# Patient Record
Sex: Female | Born: 2005
Health system: Southern US, Community
[De-identification: ages and names within clinical notes are randomized; demographics above are authoritative.]

## PROBLEM LIST (undated history)

## (undated) DIAGNOSIS — R55 Syncope and collapse: Secondary | ICD-10-CM

## (undated) DIAGNOSIS — F419 Anxiety disorder, unspecified: Secondary | ICD-10-CM

## (undated) HISTORY — PX: ROOT CANAL: SHX2363

## (undated) HISTORY — DX: Syncope and collapse: R55

## (undated) HISTORY — DX: Anxiety disorder, unspecified: F41.9

---
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
## 2023-11-12 ENCOUNTER — Encounter (HOSPITAL_BASED_OUTPATIENT_CLINIC_OR_DEPARTMENT_OTHER): Payer: Self-pay | Admitting: Emergency Medicine

## 2023-11-12 ENCOUNTER — Other Ambulatory Visit: Payer: Self-pay

## 2023-11-12 ENCOUNTER — Emergency Department (HOSPITAL_BASED_OUTPATIENT_CLINIC_OR_DEPARTMENT_OTHER)
Admission: EM | Admit: 2023-11-12 | Discharge: 2023-11-13 | Disposition: A | Discharge status: Home / Self Care | Payer: 59 | Attending: Emergency Medicine | Admitting: Emergency Medicine

## 2023-11-12 DIAGNOSIS — R112 Nausea with vomiting, unspecified: Secondary | ICD-10-CM | POA: Insufficient documentation

## 2023-11-12 DIAGNOSIS — R55 Syncope and collapse: Secondary | ICD-10-CM | POA: Diagnosis present

## 2023-11-12 DIAGNOSIS — R109 Unspecified abdominal pain: Secondary | ICD-10-CM | POA: Insufficient documentation

## 2023-11-12 DIAGNOSIS — R000 Tachycardia, unspecified: Secondary | ICD-10-CM | POA: Diagnosis not present

## 2023-11-12 DIAGNOSIS — R197 Diarrhea, unspecified: Secondary | ICD-10-CM | POA: Diagnosis not present

## 2023-11-12 LAB — CBC WITH DIFFERENTIAL/PLATELET
Abs Immature Granulocytes: 0.05 10*3/uL (ref 0.00–0.07)
Basophils Absolute: 0 10*3/uL (ref 0.0–0.1)
Basophils Relative: 0 %
Eosinophils Absolute: 0 10*3/uL (ref 0.0–0.5)
Eosinophils Relative: 0 %
HCT: 44.1 % (ref 36.0–46.0)
Hemoglobin: 15 g/dL (ref 12.0–15.0)
Immature Granulocytes: 0 %
Lymphocytes Relative: 5 %
Lymphs Abs: 0.8 10*3/uL (ref 0.7–4.0)
MCH: 29.9 pg (ref 26.0–34.0)
MCHC: 34 g/dL (ref 30.0–36.0)
MCV: 88 fL (ref 80.0–100.0)
Monocytes Absolute: 1.4 10*3/uL — ABNORMAL HIGH (ref 0.1–1.0)
Monocytes Relative: 9 %
Neutro Abs: 13.3 10*3/uL — ABNORMAL HIGH (ref 1.7–7.7)
Neutrophils Relative %: 86 %
Platelets: 382 10*3/uL (ref 150–400)
RBC: 5.01 MIL/uL (ref 3.87–5.11)
RDW: 13.2 % (ref 11.5–15.5)
WBC: 15.6 10*3/uL — ABNORMAL HIGH (ref 4.0–10.5)
nRBC: 0 % (ref 0.0–0.2)

## 2023-11-12 LAB — CBG MONITORING, ED: Glucose-Capillary: 121 mg/dL — ABNORMAL HIGH (ref 70–99)

## 2023-11-12 MED ORDER — LACTATED RINGERS IV BOLUS
1000.0000 mL | Freq: Once | INTRAVENOUS | Status: AC
  Administered 2023-11-13: 1000 mL via INTRAVENOUS

## 2023-11-12 MED ORDER — LORAZEPAM 2 MG/ML IJ SOLN
0.5000 mg | Freq: Once | INTRAMUSCULAR | Status: AC
  Administered 2023-11-13: 0.5 mg via INTRAVENOUS
  Filled 2023-11-12: qty 1

## 2023-11-12 NOTE — ED Triage Notes (Signed)
Patient c/o emesis x 8 hours.  Patient's visitor stated patient is also dizzy and "passed out" once, and hasn't been checked for fever.  Patient also endorses abdominal pain.

## 2023-11-12 NOTE — ED Provider Notes (Signed)
[place] [facility] AT [facility] Provider Note   CSN: [id number] Arrival date & time: 11/12/23  2331     History  Chief Complaint  Patient presents with   Emesis    Crystal Solis is a 18 y.o. female.  The history is provided by the patient and a parent.  Emesis Crystal Jaspers is a 18 y.o. female who presents to the Emergency Department complaining of vomiting and diarrhea.  She presents to the emergency department accompanied by her father for evaluation of vomiting and diarrhea that started about 8 hours ago.  She reports numerous episodes of vomiting and diarrhea with associated central abdominal pain.  She thinks she might have a fever but is not completely certain.  No associated dysuria.  She has also had multiple associated syncopal episodes today.  She did have 1 when she arrived to triage with a heart rate in the 170s per nursing.  This was unable to be captured on twelve-lead.  Nursing reports unresponsive episode for 10 to 15 seconds.  Patient states that everything was dark during that episode.  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  She takes oral contraceptives.  No known medical problems.     Home Medications Prior to Admission medications   Medication Sig Start Date End Date Taking? Authorizing Provider  ondansetron (ZOFRAN-ODT) 4 MG disintegrating tablet Take 1 tablet (4 mg total) by mouth every 8 (eight) hours as needed. 11/13/23  Yes Tilden Fossa, MD  benzonatate (TESSALON) 100 MG capsule Take 1 capsule by mouth 3 times daily as needed x 7 days, do not crush or chew 02/28/21     cefdinir (OMNICEF) 300 MG capsule Take 1 capsule by mouth every 12 hours x10 day(s) 02/28/21     chlorpheniramine-HYDROcodone (TUSSIONEX) 10-8 MG/5ML LQCR Take 1.5 mLs by mouth every 12 (twelve) hours.    [provider]  guaiFENesin (ROBITUSSIN) 100 MG/5ML liquid Take 200 mg by mouth 3 (three) times daily as needed. For cough    [provider]  ibuprofen (ADVIL,MOTRIN) 100 MG/5ML suspension Take 10 mg/kg by mouth every 6 (six) hours as needed. For pain/fever    [provider]  Norethindrone Acetate-Ethinyl Estradiol (LARIN 1.5/30) 1.5-30 MG-MCG tablet Take 1 tablet by mouth daily. Take continuously and take with food. 06/05/22     Norethindrone Acetate-Ethinyl Estradiol (LOESTRIN) 1.5-30 MG-MCG tablet TAKE 1 TABLET BY MOUTH ONCE DAILY WITH FOOD 04/12/20 04/12/21  Kirby Crigler, MD  Norethindrone Acetate-Ethinyl Estradiol (LOESTRIN) 1.5-30 MG-MCG tablet TAKE 1 TABLET BY MOUTH ONCE DAILY WITH FOOD. 02/27/20 02/26/21  Marcene Corning, MD      Allergies    Patient has no known allergies.    Review of Systems   Review of Systems  Gastrointestinal:  Positive for vomiting.  All other systems reviewed and are negative.   Physical Exam Updated Vital Signs BP 100/62   Pulse (!) 110   Temp 99 F (37.2 C)   Resp 18   Wt 63.5 kg   LMP  (LMP Unknown)   SpO2 97%  Physical Exam Vitals and nursing note reviewed.  Constitutional:      Appearance: She is well-developed.  HENT:     Head: Normocephalic and atraumatic.  Cardiovascular:     Rate and Rhythm: Regular rhythm. Tachycardia present.     Heart sounds: No murmur heard. Pulmonary:     Effort: Pulmonary effort is normal. No respiratory distress.     Breath sounds:  Normal breath sounds.  Abdominal:     Palpations: Abdomen is soft.     Tenderness: There is no abdominal tenderness. There is no guarding or rebound.  Musculoskeletal:        General: No tenderness.  Skin:    General: Skin is warm and dry.  Neurological:     Mental Status: She is alert and oriented to person, place, and time.  Psychiatric:     Comments: Anxious     ED Results / Procedures / Treatments   Labs (all labs ordered are listed, but only abnormal results are displayed) Labs Reviewed  CBC WITH DIFFERENTIAL/PLATELET - Abnormal; Notable for the following components:      Result  Value   WBC 15.6 (*)    Neutro Abs 13.3 (*)    Monocytes Absolute 1.4 (*)    All other components within normal limits  COMPREHENSIVE METABOLIC PANEL - Abnormal; Notable for the following components:   Glucose, Bld 130 (*)    All other components within normal limits  URINALYSIS, ROUTINE W REFLEX MICROSCOPIC - Abnormal; Notable for the following components:   APPearance HAZY (*)    Hgb urine dipstick TRACE (*)    Ketones, ur >=80 (*)    Leukocytes,Ua TRACE (*)    All other components within normal limits  D-DIMER, QUANTITATIVE - Abnormal; Notable for the following components:   D-Dimer, Quant 0.77 (*)    All other components within normal limits  URINALYSIS, MICROSCOPIC (REFLEX) - Abnormal; Notable for the following components:   Bacteria, UA FEW (*)    All other components within normal limits  CBG MONITORING, ED - Abnormal; Notable for the following components:   Glucose-Capillary 121 (*)    All other components within normal limits  PREGNANCY, URINE  LIPASE, BLOOD  TROPONIN I (HIGH SENSITIVITY)    EKG EKG Interpretation Date/Time:  Thursday November 12 2023 23:56:32 EST Ventricular Rate:  141 PR Interval:  137 QRS Duration:  77 QT Interval:  277 QTC Calculation: 425 R Axis:   74  Text Interpretation: Sinus tachycardia Confirmed by Tilden Fossa [phone]) on 11/12/2023 11:57:56 PM  Radiology CT Angio Chest PE W/Cm &/Or Wo Cm Result Date: 11/13/2023 CLINICAL DATA:  Positive D-dimer. Syncope with abdominal pain and vomiting. EXAM: CT ANGIOGRAPHY CHEST CT ABDOMEN AND PELVIS WITH CONTRAST TECHNIQUE: Multidetector CT imaging of the chest was performed using the standard protocol during bolus administration of intravenous contrast. Multiplanar CT image reconstructions and MIPs were obtained to evaluate the vascular anatomy. Multidetector CT imaging of the abdomen and pelvis was performed using the standard protocol during bolus administration of intravenous contrast. RADIATION  DOSE REDUCTION: This exam was performed according to the departmental dose-optimization program which includes automated exposure control, adjustment of the mA and/or kV according to patient size and/or use of iterative reconstruction technique. CONTRAST:  OMNIPAQUE IOHEXOL 350 MG/ML SOLN COMPARISON:  None Available. FINDINGS: CTA CHEST FINDINGS Cardiovascular: The heart size is normal. No substantial pericardial effusion. No thoracic aortic aneurysm. No substantial atherosclerosis of the thoracic aorta. There is no filling defect within the opacified pulmonary arteries to suggest the presence of an acute pulmonary embolus. Mediastinum/Nodes: No mediastinal lymphadenopathy. 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 Lymphoproliferative disorder is considered less likely but not entirely excluded. There is no hilar lymphadenopathy. The esophagus has normal imaging features. There is no axillary lymphadenopathy. Lungs/Pleura: The lungs are clear without focal pneumonia, edema, pneumothorax or pleural effusion. No suspicious pulmonary nodule  or mass. Musculoskeletal: No worrisome lytic or sclerotic osseous abnormality. Review of the MIP images confirms the above findings. CT ABDOMEN and PELVIS FINDINGS Hepatobiliary: No suspicious focal abnormality within the liver parenchyma. There is no evidence for gallstones, gallbladder wall thickening, or pericholecystic fluid. No intrahepatic or extrahepatic biliary dilation. Pancreas: No focal mass lesion. No dilatation of the main duct. No intraparenchymal cyst. No peripancreatic edema. Spleen: No splenomegaly. No suspicious focal mass lesion. Adrenals/Urinary Tract: No adrenal nodule or mass. Kidneys unremarkable. No evidence for hydroureter. The urinary bladder appears normal for the degree of distention. Stomach/Bowel: Stomach is unremarkable. No gastric wall thickening. No evidence of outlet obstruction. Duodenum is normally  positioned as is the ligament of Treitz. No small bowel wall thickening. No small bowel dilatation. The terminal ileum is normal. The appendix is normal. No gross colonic mass. No colonic wall thickening. Vascular/Lymphatic: No abdominal aortic aneurysm. No abdominal aortic atherosclerotic calcification. There is no gastrohepatic or hepatoduodenal ligament lymphadenopathy. No retroperitoneal or mesenteric lymphadenopathy. No pelvic sidewall lymphadenopathy. Reproductive: The uterus is unremarkable.  There is no adnexal mass. Other: No intraperitoneal free fluid. Musculoskeletal: No worrisome lytic or sclerotic osseous abnormality. IMPRESSION: 1. No CT evidence for acute pulmonary embolus. 2. No acute findings in the chest, abdomen, or pelvis. 3. 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 Lymphoproliferative disorder is considered less likely but not entirely excluded. Correlate clinically, and conservatively, follow-up CT chest in 3 months could be used to ensure stability. Electronically Signed   By: Kennith Center M.D.   On: 11/13/2023 06:29   CT ABDOMEN PELVIS W CONTRAST Result Date: 11/13/2023 CLINICAL DATA:  Positive D-dimer. Syncope with abdominal pain and vomiting. EXAM: CT ANGIOGRAPHY CHEST CT ABDOMEN AND PELVIS WITH CONTRAST TECHNIQUE: Multidetector CT imaging of the chest was performed using the standard protocol during bolus administration of intravenous contrast. Multiplanar CT image reconstructions and MIPs were obtained to evaluate the vascular anatomy. Multidetector CT imaging of the abdomen and pelvis was performed using the standard protocol during bolus administration of intravenous contrast. RADIATION DOSE REDUCTION: This exam was performed according to the departmental dose-optimization program which includes automated exposure control, adjustment of the mA and/or kV according to patient size and/or use of iterative reconstruction technique. CONTRAST:   OMNIPAQUE IOHEXOL 350 MG/ML SOLN COMPARISON:  None Available. FINDINGS: CTA CHEST FINDINGS Cardiovascular: The heart size is normal. No substantial pericardial effusion. No thoracic aortic aneurysm. No substantial atherosclerosis of the thoracic aorta. There is no filling defect within the opacified pulmonary arteries to suggest the presence of an acute pulmonary embolus. Mediastinum/Nodes: No mediastinal lymphadenopathy. 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 Lymphoproliferative disorder is considered less likely but not entirely excluded. There is no hilar lymphadenopathy. The esophagus has normal imaging features. There is no axillary lymphadenopathy. Lungs/Pleura: The lungs are clear without focal pneumonia, edema, pneumothorax or pleural effusion. No suspicious pulmonary nodule or mass. Musculoskeletal: No worrisome lytic or sclerotic osseous abnormality. Review of the MIP images confirms the above findings. CT ABDOMEN and PELVIS FINDINGS Hepatobiliary: No suspicious focal abnormality within the liver parenchyma. There is no evidence for gallstones, gallbladder wall thickening, or pericholecystic fluid. No intrahepatic or extrahepatic biliary dilation. Pancreas: No focal mass lesion. No dilatation of the main duct. No intraparenchymal cyst. No peripancreatic edema. Spleen: No splenomegaly. No suspicious focal mass lesion. Adrenals/Urinary Tract: No adrenal nodule or mass. Kidneys unremarkable. No evidence for hydroureter. The urinary bladder appears normal for  the degree of distention. Stomach/Bowel: Stomach is unremarkable. No gastric wall thickening. No evidence of outlet obstruction. Duodenum is normally positioned as is the ligament of Treitz. No small bowel wall thickening. No small bowel dilatation. The terminal ileum is normal. The appendix is normal. No gross colonic mass. No colonic wall thickening. Vascular/Lymphatic: No abdominal aortic aneurysm. No  abdominal aortic atherosclerotic calcification. There is no gastrohepatic or hepatoduodenal ligament lymphadenopathy. No retroperitoneal or mesenteric lymphadenopathy. No pelvic sidewall lymphadenopathy. Reproductive: The uterus is unremarkable.  There is no adnexal mass. Other: No intraperitoneal free fluid. Musculoskeletal: No worrisome lytic or sclerotic osseous abnormality. IMPRESSION: 1. No CT evidence for acute pulmonary embolus. 2. No acute findings in the chest, abdomen, or pelvis. 3. 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 Lymphoproliferative disorder is considered less likely but not entirely excluded. Correlate clinically, and conservatively, follow-up CT chest in 3 months could be used to ensure stability. Electronically Signed   By: Kennith Center M.D.   On: 11/13/2023 06:29    Procedures Procedures    Medications Ordered in ED Medications  lactated ringers bolus 1,000 mL (0 mLs Intravenous Stopped 11/13/23 0212)  LORazepam (ATIVAN) injection 0.5 mg (0.5 mg Intravenous Given 11/13/23 0041)  acetaminophen (TYLENOL) tablet 650 mg (650 mg Oral Given 11/13/23 0057)  lactated ringers bolus 1,000 mL (0 mLs Intravenous Stopped 11/13/23 0306)  sodium chloride 0.9 % bolus 500 mL (0 mLs Intravenous Stopped 11/13/23 0550)  iohexol (OMNIPAQUE) 350 MG/ML injection 100 mL (100 mLs Intravenous Contrast Given 11/13/23 0555)    ED Course/ Medical Decision Making/ A&P                                 Medical Decision Making Amount and/or Complexity of Data Reviewed Labs: ordered. Radiology: ordered.  Risk OTC drugs. Prescription drug management.   Patient with history of anxiety here for evaluation of vomiting, diarrhea.  She did have a syncopal episode in triage, heart rate was 170 on the monitor but nursing was unable to obtain twelve-lead.  Patient has had recurrent episodes of syncope in the past.  She was treated with IV fluids, lorazepam for  anxiety.  CMP without significant electrolyte abnormality.  CBC with leukocytosis.  Patient with persistent tachycardia although she is able to tolerate p.o. fluids.  She does get dizzy on standing and is slightly orthostatic initially.  A D-dimer was obtained given her oral contraceptive use and recurrent syncope, this was slightly elevated.  A CTA PE study was obtained, which is negative for PE but does demonstrate some changes to her thymus.  Given her significant abdominal pain earlier, leukocytosis a CT abdomen pelvis was obtained, which is negative for acute intra-abdominal abnormality.  UA is not consistent with UTI.  On repeat assessment later during patient's ED stay she is feeling improved, able to tolerate p.o. and she is able to ambulate without recurrent near syncopal episodes.  Her heart rate did improve as well.  Heart rates improved to 110.  She does have a history of tachycardia, in particular in the setting of healthcare encounters.  Given her recurrent syncope, will send referral to cardiology.  In terms of her vomiting and diarrhea, suspect this is self-limited, no recurrent vomiting in the emergency department.  Suspect there was an element of dehydration secondary to her symptoms.  Will send prescription for ondansetron to the pharmacy.  Discussed outpatient follow-up and return  precautions.        Final Clinical Impression(s) / ED Diagnoses Final diagnoses:  Syncope, unspecified syncope type  Nausea vomiting and diarrhea    Rx / DC Orders ED Discharge Orders          Ordered    ondansetron (ZOFRAN-ODT) 4 MG disintegrating tablet  Every 8 hours PRN        11/13/23 0647    Ambulatory referral to Cardiology        11/13/23 0648              Tilden Fossa, MD 11/13/23 [phone]

## 2023-11-13 ENCOUNTER — Emergency Department (HOSPITAL_BASED_OUTPATIENT_CLINIC_OR_DEPARTMENT_OTHER): Payer: 59

## 2023-11-13 LAB — URINALYSIS, ROUTINE W REFLEX MICROSCOPIC
Bilirubin Urine: NEGATIVE
Glucose, UA: NEGATIVE mg/dL
Ketones, ur: >=80 mg/dL — AB
Nitrite: NEGATIVE
Protein, ur: NEGATIVE mg/dL
Specific Gravity, Urine: 1.02 (ref 1.005–1.030)
pH: 7 (ref 5.0–8.0)

## 2023-11-13 LAB — COMPREHENSIVE METABOLIC PANEL
ALT: 12 U/L (ref 0–44)
AST: 22 U/L (ref 15–41)
Albumin: 3.8 g/dL (ref 3.5–5.0)
Alkaline Phosphatase: 52 U/L (ref 38–126)
Anion gap: 11 (ref 5–15)
BUN: 15 mg/dL (ref 6–20)
CO2: 22 mmol/L (ref 22–32)
Calcium: 8.9 mg/dL (ref 8.9–10.3)
Chloride: 104 mmol/L (ref 98–111)
Creatinine, Ser: 0.67 mg/dL (ref 0.44–1.00)
GFR, Estimated: >60 mL/min (ref >60)
Glucose, Bld: 130 mg/dL — ABNORMAL HIGH (ref 70–99)
Potassium: 4.2 mmol/L (ref 3.5–5.1)
Sodium: 137 mmol/L (ref 135–145)
Total Bilirubin: 0.7 mg/dL (ref 0.0–1.2)
Total Protein: 7.6 g/dL (ref 6.5–8.1)

## 2023-11-13 LAB — URINALYSIS, MICROSCOPIC (REFLEX)

## 2023-11-13 LAB — LIPASE, BLOOD: Lipase: 29 U/L (ref 11–51)

## 2023-11-13 LAB — PREGNANCY, URINE: Preg Test, Ur: NEGATIVE

## 2023-11-13 LAB — D-DIMER, QUANTITATIVE: D-Dimer, Quant: 0.77 ug{FEU}/mL — ABNORMAL HIGH (ref 0.00–0.50)

## 2023-11-13 LAB — TROPONIN I (HIGH SENSITIVITY): Troponin I (High Sensitivity): 7 ng/L (ref <18)

## 2023-11-13 MED ORDER — LACTATED RINGERS IV BOLUS
1000.0000 mL | Freq: Once | INTRAVENOUS | Status: AC
  Administered 2023-11-13: 1000 mL via INTRAVENOUS

## 2023-11-13 MED ORDER — IOHEXOL 350 MG/ML SOLN
100.0000 mL | Freq: Once | INTRAVENOUS | Status: AC | PRN
  Administered 2023-11-13: 100 mL via INTRAVENOUS

## 2023-11-13 MED ORDER — SODIUM CHLORIDE 0.9 % IV BOLUS
500.0000 mL | Freq: Once | INTRAVENOUS | Status: AC
  Administered 2023-11-13: 500 mL via INTRAVENOUS

## 2023-11-13 MED ORDER — ACETAMINOPHEN 325 MG PO TABS
650.0000 mg | ORAL_TABLET | Freq: Once | ORAL | Status: AC
Start: 2023-11-13 — End: 2023-11-13
  Administered 2023-11-13: 650 mg via ORAL
  Filled 2023-11-13: qty 2

## 2023-11-13 MED ORDER — ONDANSETRON 4 MG PO TBDP: 4.0000 mg | ORAL_TABLET | Freq: Three times a day (TID) | ORAL | 0 refills | Status: DC | PRN

## 2023-11-13 NOTE — ED Notes (Signed)
Patient provided water for PO challenge

## 2023-11-13 NOTE — Discharge Instructions (Addendum)
You had a CT scan performed in the Emergency Department today that showed changes to your chest that will need to be followed up by your family doctor.

## 2023-11-13 NOTE — ED Notes (Signed)
Patient ambulated with steady gait to restroom.  Patient stated that she felt ok walking.

## 2023-11-18 NOTE — Progress Notes (Unsigned)
Cardiology Office Note:  .   Date:  11/19/2023  ID:  Crystal Solis, DOB 02/08/2006, MRN [id number] PCP: Marcene Corning, MD  [facility] Providers Cardiologist:  None { History of Present Illness: .   Crystal Sproles is a 18 y.o. female with history of anxiety who presents for the evaluation of syncope at the request of Tilden Fossa, MD. Seen in ER 1/23 for syncope in setting of vomiting/diarrhea/dehydration.    History of Present Illness   The patient is a 18 year old female who presents with syncope. She is accompanied by her father. She was referred by the ER for evaluation of passing out spells.  She has experienced syncope since childhood, with approximately 14 to 15 episodes in her life. Recently, the episodes have increased in frequency and changed in nature. She describes fainting at the movies after prolonged sitting and vomiting afterward. Another episode occurred during a gastrointestinal illness, where she fainted while vomiting. She often cannot anticipate the episodes.  Several potential triggers for her syncope include stress, dehydration, and prolonged sitting. Her heart rate is frequently elevated, and during a recent ER visit, it remained high despite receiving three bags of IV fluids. She monitors her heart rate with a watch, noting it is often around 120 bpm. 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 She has a fear of doctors and passing out frequently when her BP is checked in offices.   She has not previously consulted a neurologist or cardiologist for these episodes. Her pediatrician attributed her elevated heart rate to anxiety. Her family history includes heart disease, with her father having familial hyperlipidemia. She reports no history of seizures, high blood pressure, diabetes, or thyroid issues. She has low blood pressure and faints with blood draws. She has not had her cholesterol  checked despite a family history of familial hyperlipidemia.  She does not smoke, use alcohol, or drugs. She is currently in school and working. She is on birth control and occasionally takes ibuprofen as needed. She had a recent episode of slurred speech and inability to speak, which was not followed up with a neurologist due to her mother's reluctance. She is currently driving but has been advised to avoid driving until her episodes are better controlled.          Problem List Anxiety    ROS: All other ROS reviewed and negative. Pertinent positives noted in the HPI.     Studies Reviewed: Marland Kitchen   EKG Interpretation Date/Time:  Thursday November 19 2023 14:50:10 EST Ventricular Rate:  120 PR Interval:  144 QRS Duration:  68 QT Interval:  420 QTC Calculation: 593 R Axis:   69  Text Interpretation: Sinus tachycardia Nonspecific ST and T wave abnormality Prolonged QT Confirmed by Lennie Odor [phone]) on 11/19/2023 2:57:16 PM   Physical Exam:   VS:  BP (!) 88/60   Pulse (!) 120   Ht 5\' 6"  (1.676 m)   Wt 140 lb 6.4 oz (63.7 kg)   LMP  (LMP Unknown)   SpO2 100%   BMI 22.66 kg/m    Wt Readings from Last 3 Encounters:  11/19/23 140 lb 6.4 oz (63.7 kg) (71%, Z= 0.54)*  11/12/23 140 lb (63.5 kg) (70%, Z= 0.53)*  04/14/19 120 lb (54.4 kg) (61%, Z= 0.27)*   * Growth percentiles are based on CDC (Girls, 2-20 Years) data.    GEN: Well nourished, well developed in no acute distress  NECK: No JVD; No carotid bruits CARDIAC: RRR, no murmurs, rubs, gallops RESPIRATORY:  Clear to auscultation without rales, wheezing or rhonchi  ABDOMEN: Soft, non-tender, non-distended EXTREMITIES:  No edema; No deformity  ASSESSMENT AND PLAN: .   Assessment and Plan    Syncope, vasovagal  Sinus tachycardia  Recurrent episodes since childhood, with recent increase in frequency. Episodes are often triggered by stress, dehydration, and prolonged sitting. Recent episode associated with gastrointestinal  illness. EKG shows sinus tachycardia. Suspected vasovagal syncope. -Order 1-week heart monitor and echocardiogram to rule out arrhythmias and structural heart abnormalities. -Check TSH and lipids today given family history of familial hyperlipidemia. -Advise increased hydration with electrolyte-rich fluids, regular meals, and increased salt intake. -Recommend use of compression stockings. -Advise patient to sit or lie down with legs elevated if feeling pre-syncopal. -Driving restriction for 4 weeks.  -Follow-up appointment in 4 weeks to review test results and assess response to conservative measures.  Family History of Familial Hyperlipidemia No personal history of high cholesterol, but family history present. -Check lipid profile today to assess risk.              Follow-up: Return in about 1 month (around 12/18/2023).  Signed, Lenna Gilford. Flora Lipps, MD, [facility]  [facility]  [address], [address] [place], [place] [address] [phone]  6:49 PM

## 2023-11-19 ENCOUNTER — Ambulatory Visit: Payer: 59 | Attending: Cardiovascular Disease | Admitting: Cardiovascular Disease

## 2023-11-19 ENCOUNTER — Encounter: Payer: Self-pay | Admitting: Cardiovascular Disease

## 2023-11-19 ENCOUNTER — Ambulatory Visit (INDEPENDENT_AMBULATORY_CARE_PROVIDER_SITE_OTHER): Payer: 59

## 2023-11-19 VITALS — BP 88/60 | HR 120 | Ht 66.0 in | Wt 140.4 lb

## 2023-11-19 DIAGNOSIS — R55 Syncope and collapse: Secondary | ICD-10-CM

## 2023-11-19 DIAGNOSIS — Z8249 Family history of ischemic heart disease and other diseases of the circulatory system: Secondary | ICD-10-CM | POA: Diagnosis not present

## 2023-11-19 NOTE — Progress Notes (Unsigned)
Enrolled for [employer] to mail a ZIO XT long term holter monitor to the patients address on file.

## 2023-11-19 NOTE — Patient Instructions (Addendum)
Medication Instructions:  Your physician recommends that you continue on your current medications as directed. Please refer to the Current Medication list given to you today.    *If you need a refill on your cardiac medications before your next appointment, please call your pharmacy*   Lab Work: TSH  LIPIDS    If you have labs (blood work) drawn today and your tests are completely normal, you will receive your results only by: MyChart Message (if you have MyChart) OR A paper copy in the mail If you have any lab test that is abnormal or we need to change your treatment, we will call you to review the results.   Testing/Procedures:  Crystal Solis- Long Term Monitor Instructions  Your physician has requested you wear a ZIO patch monitor for 14 days.  This is a single patch monitor. [employer] supplies one patch monitor per enrollment. Additional stickers are not available. Please do not apply patch if you will be having a Nuclear Stress Test,  Echocardiogram, Cardiac CT, MRI, or Chest Xray during the period you would be wearing the  monitor. The patch cannot be worn during these tests. You cannot remove and re-apply the  ZIO XT patch monitor.  Your ZIO patch monitor will be mailed 3 day USPS to your address on file. It may take 3-5 days  to receive your monitor after you have been enrolled.  Once you have received your monitor, please review the enclosed instructions. Your monitor  has already been registered assigning a specific monitor serial # to you.  Billing and Patient Assistance Program Information  We have supplied [employer] with any of your insurance information on file for billing purposes. [employer] offers a sliding scale Patient Assistance Program for patients that do not have  insurance, or whose insurance does not completely cover the cost of the ZIO monitor.  You must apply for the Patient Assistance Program to qualify for this discounted rate.  To apply, please call [employer] at  [phone], select option 4, select option 2, ask to apply for  Patient Assistance Program. Meredeth Ide will ask your household income, and how many people  are in your household. They will quote your out-of-pocket cost based on that information.  [employer] will also be able to set up a 2-month, interest-free payment plan if needed.  Applying the monitor   Shave hair from upper left chest.  Hold abrader disc by orange tab. Rub abrader in 40 strokes over the upper left chest as  indicated in your monitor instructions.  Clean area with 4 enclosed alcohol pads. Let dry.  Apply patch as indicated in monitor instructions. Patch will be placed under collarbone on left  side of chest with arrow pointing upward.  Rub patch adhesive wings for 2 minutes. Remove white label marked "1". Remove the white  label marked "2". Rub patch adhesive wings for 2 additional minutes.  While looking in a mirror, press and release button in center of patch. A small green light will  flash 3-4 times. This will be your only indicator that the monitor has been turned on.  Do not shower for the first 24 hours. You may shower after the first 24 hours.  Press the button if you feel a symptom. You will hear a small click. Record Date, Time and  Symptom in the Patient Logbook.  When you are ready to remove the patch, follow instructions on the last 2 pages of Patient  Logbook. Stick patch monitor onto the last page  of Patient Logbook.  Place Patient Logbook in the blue and white box. Use locking tab on box and tape box closed  securely. The blue and white box has prepaid postage on it. Please place it in the mailbox as  soon as possible. Your physician should have your test results approximately 7 days after the  monitor has been mailed back to [facility].  Call [facility] Customer Care at [phone] if you have questions regarding  your ZIO XT patch monitor. Call them immediately if you see an orange light  blinking on your  monitor.  If your monitor falls off in less than 4 days, contact our Monitor department at [phone].  If your monitor becomes loose or falls off after 4 days call [employer] at [phone] for  suggestions on securing your monitor  Echo will be scheduled at [address] [facility] [address].  Your physician has requested that you have an echocardiogram. Echocardiography is a painless test that uses sound waves to create images of your heart. It provides your doctor with information about the size and shape of your heart and how well your heart's chambers and valves are working. This procedure takes approximately one hour. There are no restrictions for this procedure. Please do NOT wear cologne, perfume, aftershave, or lotions (deodorant is allowed). Please arrive 15 minutes prior to your appointment time.    Follow-Up: At [facility], you and your health needs are our priority.  As part of our continuing mission to provide you with exceptional heart care, we have created designated Provider Care Teams.  These Care Teams include your primary Cardiologist (physician) and Advanced Practice Providers (APPs -  Physician Assistants and Nurse Practitioners) who all work together to provide you with the care you need, when you need it.  We recommend signing up for the patient portal called "MyChart".  Sign up information is provided on this After Visit Summary.  MyChart is used to connect with patients for Virtual Visits (Telemedicine).  Patients are able to view lab/test results, encounter notes, upcoming appointments, etc.  Non-urgent messages can be sent to your provider as well.   To learn more about what you can do with MyChart, go to [email or web address].    Your next appointment:   4 week(s): OKAY TO OVERBOOK   The format for your next appointment:   In Person  Provider:   Lennie Odor, MD      Other Instructions

## 2023-11-20 LAB — LIPID PANEL
Chol/HDL Ratio: 4.3 {ratio} (ref 0.0–4.4)
Cholesterol, Total: 189 mg/dL — ABNORMAL HIGH (ref 100–169)
HDL: 44 mg/dL (ref >39)
LDL Chol Calc (NIH): 117 mg/dL — ABNORMAL HIGH (ref 0–109)
Triglycerides: 158 mg/dL — ABNORMAL HIGH (ref 0–89)
VLDL Cholesterol Cal: 28 mg/dL (ref 5–40)

## 2023-11-20 LAB — TSH: TSH: 5.27 u[IU]/mL — ABNORMAL HIGH (ref 0.450–4.500)

## 2023-11-22 ENCOUNTER — Encounter: Payer: Self-pay | Admitting: Cardiovascular Disease

## 2023-11-24 DIAGNOSIS — R55 Syncope and collapse: Secondary | ICD-10-CM | POA: Diagnosis not present

## 2023-12-20 NOTE — Progress Notes (Unsigned)
 Cardiology Office Note:  .   Date:  12/21/2023  ID:  Crystal Solis, DOB Jan 08, 2006, MRN [id number] PCP: No primary care provider on file.  [facility] Providers Cardiologist:  None {  History of Present Illness: .    Chief Complaint  Patient presents with   Follow-up    Crystal Solis is a 18 y.o. female with history of vasovagal syncope who presents for follow-up.    History of Present Illness   Crystal Solis is a 18 year old female with vasovagal syncope who presents for follow-up.  She has not experienced any episodes of syncope in the past month. However, she experiences dizziness and lightheadedness, particularly when dehydrated or exposed to heat. An episode at a nail salon involved dizziness while sitting, which resolved after drinking water.  She has completed wearing a heart monitor, but the results are pending. An echocardiogram was ordered but not completed due to a misunderstanding at the facility. Her EKG shows sinus tachycardia with a heart rate of 120 bpm. She does not typically feel her heart racing unless anxious. No chest pain.  There is a mention of a slightly abnormal thyroid test, and she plans to follow up with a primary care physician for further evaluation.  Her father has high cholesterol, but she does not.           Problem List Anxiety Vasovagal Syncope     ROS: All other ROS reviewed and negative. Pertinent positives noted in the HPI.     Studies Reviewed: Marland Kitchen   EKG Interpretation Date/Time:  Monday December 21 2023 09:11:13 EST Ventricular Rate:  120 PR Interval:  148 QRS Duration:  70 QT Interval:  294 QTC Calculation: 415 R Axis:   61  Text Interpretation: Sinus tachycardia Possible Left atrial enlargement Cannot rule out Anterior infarct , age undetermined ST & T wave abnormality, consider inferior ischemia Confirmed by Lennie Odor [id number]) on 12/21/2023 9:17:31 AM   Physical Exam:   VS:  BP 122/76   Pulse (!) 120   Ht 5\' 7"  (1.702  m)   Wt 143 lb 9.6 oz (65.1 kg)   SpO2 97%   BMI 22.49 kg/m    Wt Readings from Last 3 Encounters:  12/21/23 143 lb 9.6 oz (65.1 kg) (74%, Z= 0.65)*  11/19/23 140 lb 6.4 oz (63.7 kg) (71%, Z= 0.54)*  11/12/23 140 lb (63.5 kg) (70%, Z= 0.53)*   * Growth percentiles are based on CDC (Girls, 2-20 Years) data.    GEN: Well nourished, well developed in no acute distress NECK: No JVD; No carotid bruits CARDIAC: RRR, no murmurs, rubs, gallops RESPIRATORY:  Clear to auscultation without rales, wheezing or rhonchi  ABDOMEN: Soft, non-tender, non-distended EXTREMITIES:  No edema; No deformity  ASSESSMENT AND PLAN: .   Assessment and Plan    Vasovagal Syncope No further episodes of syncope. Reports dizziness and lightheadedness, particularly when dehydrated or in heat. EKG shows sinus tachycardia. Heart monitor results pending. -Monitor pending -Schedule echocardiogram. -Continue hydration and increased salt intake. -we discussed conservative approach to her treatment which is going well  -Consider consultation with neurology to rule out other causes.  Abnormal Thyroid Stimulating Hormone (TSH) TSH levels were reported as abnormal. No symptoms of hypothyroidism reported. -Follow up with primary care physician for further evaluation and management of thyroid function.  General Health Maintenance -Continue regular follow-ups with primary care physician. -Return to cardiology as needed.  Follow-up: Return if symptoms worsen or fail to improve.   Signed, Lenna Gilford. Flora Lipps, MD, [facility]  [facility]  [address] [place], [place] [address] [phone]  5:24 PM

## 2023-12-21 ENCOUNTER — Encounter: Payer: Self-pay | Admitting: Cardiovascular Disease

## 2023-12-21 ENCOUNTER — Ambulatory Visit: Payer: 59 | Attending: Cardiovascular Disease | Admitting: Cardiovascular Disease

## 2023-12-21 VITALS — BP 122/76 | HR 120 | Ht 67.0 in | Wt 143.6 lb

## 2023-12-21 DIAGNOSIS — R55 Syncope and collapse: Secondary | ICD-10-CM

## 2023-12-21 DIAGNOSIS — R000 Tachycardia, unspecified: Secondary | ICD-10-CM | POA: Diagnosis not present

## 2023-12-21 NOTE — Patient Instructions (Signed)
 Medication Instructions:  Your physician recommends that you continue on your current medications as directed. Please refer to the Current Medication list given to you today.    *If you need a refill on your cardiac medications before your next appointment, please call your pharmacy*   Lab Work:  None   If you have labs (blood work) drawn today and your tests are completely normal, you will receive your results only by: MyChart Message (if you have MyChart) OR A paper copy in the mail If you have any lab test that is abnormal or we need to change your treatment, we will call you to review the results.   Testing/Procedures: Echo will be scheduled at [address].  Your physician has requested that you have an echocardiogram. Echocardiography is a painless test that uses sound waves to create images of your heart. It provides your doctor with information about the size and shape of your heart and how well your heart's chambers and valves are working. This procedure takes approximately one hour. There are no restrictions for this procedure. Please do NOT wear cologne, perfume, aftershave, or lotions (deodorant is allowed). Please arrive 15 minutes prior to your appointment time.   Follow-Up: At [facility], you and your health needs are our priority.  As part of our continuing mission to provide you with exceptional heart care, we have created designated Provider Care Teams.  These Care Teams include your primary Cardiologist (physician) and Advanced Practice Providers (APPs -  Physician Assistants and Nurse Practitioners) who all work together to provide you with the care you need, when you need it.  We recommend signing up for the patient portal called "MyChart".  Sign up information is provided on this After Visit Summary.  MyChart is used to connect with patients for Virtual Visits (Telemedicine).  Patients are able to view lab/test results, encounter notes, upcoming  appointments, etc.  Non-urgent messages can be sent to your provider as well.   To learn more about what you can do with MyChart, go to [email or web address].    Your next appointment:   Follow up as needed   Provider:   Lennie Odor, MD     Other Instructions

## 2023-12-22 ENCOUNTER — Encounter: Payer: Self-pay | Admitting: Cardiovascular Disease

## 2024-01-11 ENCOUNTER — Ambulatory Visit (HOSPITAL_COMMUNITY)

## 2024-01-15 ENCOUNTER — Ambulatory Visit: Payer: 59 | Admitting: Cardiovascular Disease

## 2024-02-22 ENCOUNTER — Ambulatory Visit (HOSPITAL_COMMUNITY): Attending: Cardiovascular Disease

## 2024-02-26 ENCOUNTER — Encounter (HOSPITAL_COMMUNITY): Payer: Self-pay | Admitting: Cardiovascular Disease

## 2024-09-02 ENCOUNTER — Other Ambulatory Visit (HOSPITAL_COMMUNITY): Payer: Self-pay

## 2024-09-02 ENCOUNTER — Encounter: Payer: Self-pay | Admitting: Nurse Practitioner

## 2024-09-02 ENCOUNTER — Ambulatory Visit: Admitting: Nurse Practitioner

## 2024-09-02 VITALS — BP 128/96 | HR 121 | Temp 97.6°F | Ht 67.5 in | Wt 148.4 lb

## 2024-09-02 DIAGNOSIS — R112 Nausea with vomiting, unspecified: Secondary | ICD-10-CM

## 2024-09-02 DIAGNOSIS — F419 Anxiety disorder, unspecified: Secondary | ICD-10-CM

## 2024-09-02 DIAGNOSIS — R55 Syncope and collapse: Secondary | ICD-10-CM

## 2024-09-02 DIAGNOSIS — R7301 Impaired fasting glucose: Secondary | ICD-10-CM

## 2024-09-02 DIAGNOSIS — R7989 Other specified abnormal findings of blood chemistry: Secondary | ICD-10-CM

## 2024-09-02 MED ORDER — SERTRALINE HCL 25 MG PO TABS
25.0000 mg | ORAL_TABLET | Freq: Every day | ORAL | 1 refills | Status: DC
  Filled 2024-09-02: qty 30, 30d supply, fill #0

## 2024-09-02 NOTE — Patient Instructions (Addendum)
 It was great to see you!  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 Lymphoproliferative disorder is considered less likely but not entirely excluded. Correlate clinically, and conservatively, follow-up CT chest in 3 months could be used to ensure stability.   Let me know if you would like to update a CT scan   Let's schedule a lab visit  Start zoloft 1 tablet daily for anxiety   Peppermint or ginger hard candy can help with nausea   Let's follow-up in 4 weeks, sooner if you have concerns.  If a referral was placed today, you will be contacted for an appointment. Please note that routine referrals can sometimes take up to 3-4 weeks to process. Please call our office if you haven't heard anything after this time frame.  Take care,  Tinnie Harada, NP

## 2024-09-02 NOTE — Progress Notes (Signed)
 New Patient Visit  BP (!) 128/96   Pulse (!) 121   Temp 97.6 F (36.4 C)   Ht 5' 7.5 (1.715 m)   Wt 148 lb 6.4 oz (67.3 kg)   SpO2 99%   BMI 22.90 kg/m    Subjective:    Patient ID: Crystal Solis, female    DOB: 2006/07/10, 18 y.o.   MRN: [id number]  CC: Chief Complaint  Patient presents with   Establish Care    NP. Est. Care concerns with anxiety, vomiting a lot, and passing out    HPI: Crystal Solis is a 18 y.o. female presents for new patient visit to establish care.  Introduced to publishing rights manager role and practice setting.  All questions answered.  Discussed provider/patient relationship and expectations.  Discussed the use of AI scribe software for clinical note transcription with the patient, who gave verbal consent to proceed.  History of Present Illness   Crystal Solis is a 18 year old female with a history of syncope who presents with recurrent vomiting and nausea.   She has a history of multiple episodes of syncope. She can feel when they are about to come on and will sit or lay down. She went to cardiology and didn't have any causes identified. She tends to pass out with needles - labs or vaccines.   She experiences recurrent vomiting several times a week, often preceded by nausea and occurring hours after eating. She has attempted to identify food triggers without consistent results. Tums and Zofran  have been ineffective, with Zofran  causing constipation. Anxiety significantly impacts her, leading to panic attacks and anxiety-induced vomiting, such as at a concert. She denies smoking or alcohol use. Her family history includes heart problems, stroke, anxiety, and possible bipolar disorder. She is on birth control pills for cramps and skin issues. She denies stomach pain, constipation, diarrhea, chest pain, shortness of breath, frequent urination, burning with urination, and headaches. She reports dizziness and lightheadedness associated with anxiety.         09/02/2024    8:44 AM  Depression screen PHQ 2/9  Decreased Interest 3  Down, Depressed, Hopeless 0  PHQ - 2 Score 3  Altered sleeping 1  Tired, decreased energy 2  Change in appetite 0  Feeling bad or failure about yourself  0  Trouble concentrating 0  Moving slowly or fidgety/restless 0  Suicidal thoughts 0  PHQ-9 Score 6  Difficult doing work/chores Somewhat difficult      09/02/2024    8:45 AM  GAD 7 : Generalized Anxiety Score  Nervous, Anxious, on Edge 3  Control/stop worrying 2  Worry too much - different things 2  Trouble relaxing 2  Restless 0  Easily annoyed or irritable 2  Afraid - awful might happen 2  Total GAD 7 Score 13  Anxiety Difficulty Somewhat difficult    Past Medical History:  Diagnosis Date   Anxiety    Syncope     Past Surgical History:  Procedure Laterality Date   ROOT CANAL      Family History  Problem Relation Age of Onset   Anxiety disorder Mother    Stroke Father    Heart disease Paternal Grandfather      Social History   Tobacco Use   Smoking status: Never   Smokeless tobacco: Never  Vaping Use   Vaping status: Never Used  Substance Use Topics   Alcohol use: Never   Drug use: Never    Current Outpatient Medications on File  Prior to Visit  Medication Sig Dispense Refill   JUNEL  FE 1.5/30 1.5-30 MG-MCG tablet Take 1 tablet by mouth daily.     ibuprofen (ADVIL,MOTRIN) 100 MG/5ML suspension Take 10 mg/kg by mouth every 6 (six) hours as needed. For pain/fever (Patient not taking: Reported on 09/02/2024)     No current facility-administered medications on file prior to visit.     Review of Systems  Constitutional: Negative.   HENT: Negative.    Eyes: Negative.   Respiratory: Negative.    Cardiovascular: Negative.   Gastrointestinal:  Positive for nausea and vomiting. Negative for abdominal pain, constipation and diarrhea.  Endocrine: Negative.   Genitourinary: Negative.   Musculoskeletal: Negative.   Skin:  Negative.   Neurological:  Positive for syncope and light-headedness (at times). Negative for dizziness and headaches.  Psychiatric/Behavioral:  The patient is nervous/anxious.       Objective:    BP (!) 128/96   Pulse (!) 121   Temp 97.6 F (36.4 C)   Ht 5' 7.5 (1.715 m)   Wt 148 lb 6.4 oz (67.3 kg)   SpO2 99%   BMI 22.90 kg/m   Wt Readings from Last 3 Encounters:  09/02/24 148 lb 6.4 oz (67.3 kg)  12/21/23 143 lb 9.6 oz (65.1 kg) (74%, Z= 0.65)*  11/19/23 140 lb 6.4 oz (63.7 kg) (71%, Z= 0.54)*   * Growth percentiles are based on CDC (Girls, 2-20 Years) data.    BP Readings from Last 3 Encounters:  09/02/24 (!) 128/96  12/21/23 122/76  11/19/23 (!) 88/60    Physical Exam Vitals and nursing note reviewed.  Constitutional:      General: She is not in acute distress.    Appearance: Normal appearance.  HENT:     Head: Normocephalic and atraumatic.     Right Ear: Tympanic membrane, ear canal and external ear normal.     Left Ear: Tympanic membrane, ear canal and external ear normal.     Mouth/Throat:     Mouth: Mucous membranes are moist.     Pharynx: No posterior oropharyngeal erythema.  Eyes:     Conjunctiva/sclera: Conjunctivae normal.  Cardiovascular:     Rate and Rhythm: Normal rate and regular rhythm.     Pulses: Normal pulses.     Heart sounds: Normal heart sounds.  Pulmonary:     Effort: Pulmonary effort is normal.     Breath sounds: Normal breath sounds.  Abdominal:     Palpations: Abdomen is soft.     Tenderness: There is no abdominal tenderness.  Musculoskeletal:        General: Normal range of motion.     Cervical back: Normal range of motion and neck supple.     Right lower leg: No edema.     Left lower leg: No edema.  Lymphadenopathy:     Cervical: No cervical adenopathy.  Skin:    General: Skin is warm and dry.  Neurological:     General: No focal deficit present.     Mental Status: She is alert and oriented to person, place, and time.      Cranial Nerves: No cranial nerve deficit.     Coordination: Coordination normal.     Gait: Gait normal.  Psychiatric:        Mood and Affect: Mood normal.        Behavior: Behavior normal.        Thought Content: Thought content normal.        Judgment: Judgment  normal.        Assessment & Plan:   Problem List Items Addressed This Visit       Digestive   Nausea and vomiting   Intermittent nausea and vomiting may be linked to anxiety or food allergies. Previous Zofran  use caused constipation. Differential includes anxiety-related nausea and potential food allergies. Ordered blood tests for food allergies, including gluten and alpha-gal. Recommended peppermint and ginger candies for nausea management. Check CMP, CBC, lipase as well.       Relevant Orders   CBC with Differential/Platelet   Comprehensive metabolic panel with GFR   Food Allergy Profile   Alpha-Gal Panel   Celiac Disease Panel   Lipase     Endocrine   IFG (impaired fasting glucose)   Check A1c and treat based on results.       Relevant Orders   Hemoglobin A1c     Other   Anxiety - Primary   Chronic, not controlled. Anxiety contributes to nausea and vomiting, exacerbated by fear of vomiting and social situations. No previous medication use due to concerns about side effects. Will start zoloft 25mg  once daily, with dosage adjustment based on tolerance and effectiveness. Discussed possible side effects. Scheduled follow-up in four weeks to assess medication response.      Relevant Medications   sertraline (ZOLOFT) 25 MG tablet   Elevated TSH   Previous abnormal TSH levels noted. Re-evaluation needed to assess current thyroid status. Check TSH, free T4, and TPO antibodies.       Relevant Orders   TSH   T4, free   Thyroid Peroxidase Antibodies (TPO) (REFL)   Syncope   Recurrent syncope is triggered by prolonged standing or hair brushing, linked to dehydration and anxiety. No recent episodes, in the last  year per patient report. Advised to avoid standing with legs straight and to sit or lie down if dizzy. Recommended sweet candy or drinks to prevent syncope from blood pressure or blood sugar drops.        Follow up plan: Return in about 4 weeks (around 09/30/2024) for Anxiety.  Natina Wiginton A Jevaeh Shams

## 2024-09-04 NOTE — Assessment & Plan Note (Signed)
 Intermittent nausea and vomiting may be linked to anxiety or food allergies. Previous Zofran  use caused constipation. Differential includes anxiety-related nausea and potential food allergies. Ordered blood tests for food allergies, including gluten and alpha-gal. Recommended peppermint and ginger candies for nausea management. Check CMP, CBC, lipase as well.

## 2024-09-04 NOTE — Assessment & Plan Note (Signed)
Check A1c and treat based on results. 

## 2024-09-04 NOTE — Assessment & Plan Note (Signed)
 Recurrent syncope is triggered by prolonged standing or hair brushing, linked to dehydration and anxiety. No recent episodes, in the last year per patient report. Advised to avoid standing with legs straight and to sit or lie down if dizzy. Recommended sweet candy or drinks to prevent syncope from blood pressure or blood sugar drops.

## 2024-09-04 NOTE — Assessment & Plan Note (Signed)
 Previous abnormal TSH levels noted. Re-evaluation needed to assess current thyroid status. Check TSH, free T4, and TPO antibodies.

## 2024-09-04 NOTE — Assessment & Plan Note (Signed)
 Chronic, not controlled. Anxiety contributes to nausea and vomiting, exacerbated by fear of vomiting and social situations. No previous medication use due to concerns about side effects. Will start zoloft 25mg  once daily, with dosage adjustment based on tolerance and effectiveness. Discussed possible side effects. Scheduled follow-up in four weeks to assess medication response.

## 2024-09-30 ENCOUNTER — Ambulatory Visit: Admitting: Nurse Practitioner

## 2024-09-30 ENCOUNTER — Encounter: Payer: Self-pay | Admitting: Nurse Practitioner

## 2024-09-30 VITALS — BP 118/80 | HR 123 | Temp 96.9°F | Ht 67.5 in | Wt 147.0 lb

## 2024-09-30 DIAGNOSIS — F419 Anxiety disorder, unspecified: Secondary | ICD-10-CM

## 2024-09-30 DIAGNOSIS — R112 Nausea with vomiting, unspecified: Secondary | ICD-10-CM

## 2024-09-30 DIAGNOSIS — R7989 Other specified abnormal findings of blood chemistry: Secondary | ICD-10-CM

## 2024-09-30 DIAGNOSIS — R7301 Impaired fasting glucose: Secondary | ICD-10-CM

## 2024-09-30 LAB — COMPREHENSIVE METABOLIC PANEL WITH GFR
ALT: 20 U/L (ref 0–35)
AST: 41 U/L — ABNORMAL HIGH (ref 0–37)
Albumin: 4.3 g/dL (ref 3.5–5.2)
Alkaline Phosphatase: 58 U/L (ref 39–117)
BUN: 8 mg/dL (ref 6–23)
CO2: 25 meq/L (ref 19–32)
Calcium: 9.7 mg/dL (ref 8.4–10.5)
Chloride: 104 meq/L (ref 96–112)
Creatinine, Ser: 0.67 mg/dL (ref 0.40–1.20)
GFR: 125.91 mL/min (ref >60.00)
Glucose, Bld: 82 mg/dL (ref 70–99)
Potassium: 3.5 meq/L (ref 3.5–5.1)
Sodium: 138 meq/L (ref 135–145)
Total Bilirubin: 0.4 mg/dL (ref 0.2–1.2)
Total Protein: 7.7 g/dL (ref 6.0–8.3)

## 2024-09-30 LAB — CBC WITH DIFFERENTIAL/PLATELET
Basophils Absolute: 0.1 K/uL (ref 0.0–0.1)
Basophils Relative: 0.7 % (ref 0.0–3.0)
Eosinophils Absolute: 0.1 K/uL (ref 0.0–0.7)
Eosinophils Relative: 1.5 % (ref 0.0–5.0)
HCT: 41.9 % (ref 36.0–46.0)
Hemoglobin: 14 g/dL (ref 12.0–15.0)
Lymphocytes Relative: 33.2 % (ref 12.0–46.0)
Lymphs Abs: 3.1 K/uL (ref 0.7–4.0)
MCHC: 33.5 g/dL (ref 30.0–36.0)
MCV: 88.5 fl (ref 78.0–100.0)
Monocytes Absolute: 0.6 K/uL (ref 0.1–1.0)
Monocytes Relative: 6.4 % (ref 3.0–12.0)
Neutro Abs: 5.4 K/uL (ref 1.4–7.7)
Neutrophils Relative %: 58.2 % (ref 43.0–77.0)
Platelets: 366 K/uL (ref 150.0–400.0)
RBC: 4.73 Mil/uL (ref 3.87–5.11)
RDW: 13.4 % (ref 11.5–14.6)
WBC: 9.3 K/uL (ref 4.5–10.5)

## 2024-09-30 LAB — LIPASE: Lipase: 22 U/L (ref 11.0–59.0)

## 2024-09-30 LAB — HEMOGLOBIN A1C: Hgb A1c MFr Bld: 4.7 % (ref 4.6–6.5)

## 2024-09-30 LAB — T4, FREE: Free T4: 0.79 ng/dL (ref 0.60–1.60)

## 2024-09-30 LAB — TSH: TSH: 3.4 u[IU]/mL (ref 0.35–5.50)

## 2024-09-30 MED ORDER — SERTRALINE HCL 50 MG PO TABS: 50.0000 mg | ORAL_TABLET | Freq: Every day | ORAL | 1 refills | Status: AC

## 2024-09-30 NOTE — Assessment & Plan Note (Signed)
 Chronic, ongoing, not controlled. Her condition has improved on the current medication regimen, though mild nausea persists but is better. Increased Zoloft  to 50 mg daily and sent the prescription to [facility] pharmacy. Follow-up in 2-3 months.

## 2024-09-30 NOTE — Assessment & Plan Note (Signed)
 Intermittent nausea has improved in frequency and she has had no vomiting since last visit. Will still complete labs from last visit, but most likely from anxiety.

## 2024-09-30 NOTE — Progress Notes (Signed)
 Established Patient Office Visit  Subjective   Patient ID: Crystal Solis, female    DOB: [birth year]  Age: 18 y.o. MRN: [id number]  Chief Complaint  Patient presents with   Anxiety    Follow up, no concerns, with fasting labs    HPI Discussed the use of AI scribe software for clinical note transcription with the patient, who gave verbal consent to proceed.  History of Present Illness   Crystal  Solis is a 18 year old female who presents for follow-up and blood work. She is accompanied by her father.  Her anxiety is stable with slight improvement on her current medication, without new side effects. She has had no syncope since the last visit. Nausea has occurred about three times in the past month, improved from prior months. She has not had any vomiting.        09/30/2024    8:31 AM 09/02/2024    8:44 AM  Depression screen PHQ 2/9  Decreased Interest 1 3  Down, Depressed, Hopeless 1 0  PHQ - 2 Score 2 3  Altered sleeping 0 1  Tired, decreased energy 1 2  Change in appetite 0 0  Feeling bad or failure about yourself  0 0  Trouble concentrating 1 0  Moving slowly or fidgety/restless 0 0  Suicidal thoughts 0 0  PHQ-9 Score 4 6  Difficult doing work/chores Not difficult at all Somewhat difficult      09/30/2024    8:32 AM 09/02/2024    8:45 AM  GAD 7 : Generalized Anxiety Score  Nervous, Anxious, on Edge 2 3  Control/stop worrying 2 2  Worry too much - different things 1 2  Trouble relaxing 1 2  Restless 0 0  Easily annoyed or irritable 1 2  Afraid - awful might happen 1 2  Total GAD 7 Score 8 13  Anxiety Difficulty Somewhat difficult Somewhat difficult     ROS See pertinent positives and negatives per HPI.    Objective:     BP 118/80 (BP Location: Left Arm, Patient Position: Sitting, Cuff Size: Normal)   Pulse (!) 123   Temp (!) 96.9 F (36.1 C)   Ht 5' 7.5 (1.715 m)   Wt 147 lb (66.7 kg)   LMP 09/02/2024 (Approximate)   SpO2 100%   BMI 22.68 kg/m     Physical Exam Vitals and nursing note reviewed.  Constitutional:      General: She is not in acute distress.    Appearance: Normal appearance.  HENT:     Head: Normocephalic.  Eyes:     Conjunctiva/sclera: Conjunctivae normal.  Cardiovascular:     Rate and Rhythm: Normal rate and regular rhythm.     Pulses: Normal pulses.     Heart sounds: Normal heart sounds.  Pulmonary:     Effort: Pulmonary effort is normal.     Breath sounds: Normal breath sounds.  Abdominal:     Palpations: Abdomen is soft.     Tenderness: There is no abdominal tenderness.  Musculoskeletal:     Cervical back: Normal range of motion.  Skin:    General: Skin is warm.  Neurological:     General: No focal deficit present.     Mental Status: She is alert and oriented to person, place, and time.  Psychiatric:        Mood and Affect: Mood normal.        Behavior: Behavior normal.        Thought Content: Thought content  normal.        Judgment: Judgment normal.       Assessment & Plan:   Problem List Items Addressed This Visit       Digestive   Nausea and vomiting   Intermittent nausea has improved in frequency and she has had no vomiting since last visit. Will still complete labs from last visit, but most likely from anxiety.         Other   Anxiety - Primary   Chronic, ongoing, not controlled. Her condition has improved on the current medication regimen, though mild nausea persists but is better. Increased Zoloft  to 50 mg daily and sent the prescription to [facility]. Follow-up in 2-3 months.       Relevant Medications   sertraline  (ZOLOFT ) 50 MG tablet   Return in about 3 months (around 12/29/2024) for 2-3 months , Anxiety.    Tinnie DELENA Harada, NP

## 2024-09-30 NOTE — Patient Instructions (Signed)
 It was great to see you!  We are checking your labs today and will let you know the results via mychart/phone.   Increase the zoloft  to 50mg  daily - take 1 tablet daily   Let's follow-up in 2-3 months, sooner if you have concerns.  If a referral was placed today, you will be contacted for an appointment. Please note that routine referrals can sometimes take up to 3-4 weeks to process. Please call our office if you haven't heard anything after this time frame.  Take care,  Tinnie Harada, NP

## 2024-10-03 ENCOUNTER — Ambulatory Visit: Payer: Self-pay | Admitting: Nurse Practitioner

## 2024-10-04 LAB — CELIAC DISEASE PANEL
(tTG) Ab, IgA: 18.4 U/mL — ABNORMAL HIGH
(tTG) Ab, IgG: <1 U/mL
Deamidated Gliadin Abs, IgG: 60.6 U/mL — ABNORMAL HIGH
Gliadin IgA: 1.1 U/mL
Immunoglobulin A: 246 mg/dL (ref 47–310)

## 2024-10-04 LAB — FOOD ALLERGY PROFILE

## 2024-10-04 LAB — THYROID PEROXIDASE ANTIBODIES (TPO) (REFL): Thyroperoxidase Ab SerPl-aCnc: <1 [IU]/mL (ref <9)

## 2024-10-04 LAB — ALPHA-GAL PANEL
Allergen, Mutton, f88: <0.1 kU/L
Allergen, Pork, f26: <0.1 kU/L
Beef: <0.1 kU/L
CLASS: 0
CLASS: 0
Class: 0
GALACTOSE-ALPHA-1,3-GALACTOSE IGE*: <0.1 kU/L (ref <0.10)

## 2024-10-04 LAB — INTERPRETATION:

## 2024-10-17 ENCOUNTER — Other Ambulatory Visit (HOSPITAL_COMMUNITY): Payer: Self-pay

## 2024-10-17 MED ORDER — ONDANSETRON HCL 4 MG PO TABS
4.0000 mg | ORAL_TABLET | Freq: Three times a day (TID) | ORAL | 0 refills | Status: AC | PRN
  Filled 2024-10-17: qty 18, 21d supply, fill #0

## 2024-12-05 ENCOUNTER — Ambulatory Visit: Admitting: Nurse Practitioner
